# Patient Record
Sex: Male | Born: 2000 | Race: White | Hispanic: No | Marital: Single | Smoking: Never smoker
Health system: Southern US, Community
[De-identification: ages and names within clinical notes are randomized; demographics above are authoritative.]

---
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
## 2022-04-18 ENCOUNTER — Ambulatory Visit
Admission: EM | Admit: 2022-04-18 | Discharge: 2022-04-18 | Disposition: A | Discharge status: Home / Self Care | Payer: Managed Care, Other (non HMO) | Attending: Nurse Practitioner | Admitting: Nurse Practitioner

## 2022-04-18 DIAGNOSIS — Z1152 Encounter for screening for COVID-19: Secondary | ICD-10-CM | POA: Insufficient documentation

## 2022-04-18 DIAGNOSIS — J029 Acute pharyngitis, unspecified: Secondary | ICD-10-CM | POA: Insufficient documentation

## 2022-04-18 LAB — POCT RAPID STREP A (OFFICE): Rapid Strep A Screen: NEGATIVE

## 2022-04-18 MED ORDER — BENZONATATE 100 MG PO CAPS: 100.0000 mg | ORAL_CAPSULE | Freq: Three times a day (TID) | ORAL | 0 refills | Status: DC | PRN

## 2022-04-18 MED ORDER — FLUTICASONE PROPIONATE 50 MCG/ACT NA SUSP: 1.0000 | Freq: Every day | NASAL | 0 refills | Status: DC

## 2022-04-18 NOTE — ED Provider Notes (Signed)
[facility]    CSN: [id number] Arrival date & time: 04/18/22  1121      History   Chief Complaint No chief complaint on file.   HPI Eduardo Klein is a 21 y.o. male.   Patient presents with 1 day of sore throat, dry cough, chest and nasal congestion, runny nose, postnasal drainage, sneezing, headache.  He denies fever, shortness of breath or chest pain, ear pain, abdominal pain, nausea/vomiting, diarrhea, decreased appetite, loss of taste or smell, new rash, and fatigue.  No known sick contacts.  Thinks his symptoms are secondary to allergies, however he wanted to come in and be sure before he went to work.  Has taken ibuprofen and acetaminophen for symptoms with minimal relief.    History reviewed. No pertinent past medical history.  There are no problems to display for this patient.   History reviewed. No pertinent surgical history.     Home Medications    Prior to Admission medications   Medication Sig Start Date End Date Taking? Authorizing Provider  benzonatate (TESSALON) 100 MG capsule Take 1 capsule (100 mg total) by mouth 3 (three) times daily as needed for cough. Do not take with alcohol or while driving or operating heavy machinery.  May cause drowsiness. 04/18/22  Yes Valentino Nose, NP  fluticasone (FLONASE) 50 MCG/ACT nasal spray Place 1 spray into both nostrils daily. 04/18/22  Yes Valentino Nose, NP    Family History History reviewed. No pertinent family history.  Social History Social History   Tobacco Use   Smoking status: Never   Smokeless tobacco: Never     Allergies   Patient has no known allergies.   Review of Systems Review of Systems Per HPI  Physical Exam Triage Vital Signs ED Triage Vitals  Enc Vitals Group     BP 04/18/22 1148 130/75     Pulse Rate 04/18/22 1148 (!) 15     Resp 04/18/22 1148 20     Temp 04/18/22 1148 98 F (36.7 C)     Temp Source 04/18/22 1148 Oral     SpO2 04/18/22 1148 98 %      Weight --      Height --      Head Circumference --      Peak Flow --      Pain Score 04/18/22 1149 6     Pain Loc --      Pain Edu? --      Excl. in GC? --    No data found.  Updated Vital Signs BP 130/75   Pulse 75   Temp 98 F (36.7 C) (Oral)   Resp 20   SpO2 98%   Visual Acuity Right Eye Distance:   Left Eye Distance:   Bilateral Distance:    Right Eye Near:   Left Eye Near:    Bilateral Near:     Physical Exam Vitals and nursing note reviewed.  Constitutional:      General: He is not in acute distress.    Appearance: Normal appearance. He is not ill-appearing or toxic-appearing.  HENT:     Head: Normocephalic and atraumatic.     Right Ear: Tympanic membrane, ear canal and external ear normal. There is impacted cerumen.     Left Ear: Tympanic membrane, ear canal and external ear normal.     Nose: Rhinorrhea present. No congestion.     Mouth/Throat:     Mouth: Mucous membranes are moist.  Pharynx: Oropharynx is clear. No oropharyngeal exudate or posterior oropharyngeal erythema.  Eyes:     General: No scleral icterus.    Extraocular Movements: Extraocular movements intact.  Cardiovascular:     Rate and Rhythm: Normal rate and regular rhythm.  Pulmonary:     Effort: Pulmonary effort is normal. No respiratory distress.     Breath sounds: Normal breath sounds. No wheezing, rhonchi or rales.  Abdominal:     General: Abdomen is flat. Bowel sounds are normal. There is no distension.     Palpations: Abdomen is soft.     Tenderness: There is no abdominal tenderness. There is no guarding.  Musculoskeletal:     Cervical back: Normal range of motion and neck supple.  Lymphadenopathy:     Cervical: No cervical adenopathy.  Skin:    General: Skin is warm and dry.     Coloration: Skin is not jaundiced or pale.     Findings: No erythema or rash.  Neurological:     Mental Status: He is alert and oriented to person, place, and time.  Psychiatric:         Behavior: Behavior is cooperative.      UC Treatments / Results  Labs (all labs ordered are listed, but only abnormal results are displayed) Labs Reviewed  SARS CORONAVIRUS 2 (TAT 6-24 HRS)  POCT RAPID STREP A (OFFICE)    EKG   Radiology No results found.  Procedures Procedures (including critical care time)  Medications Ordered in UC Medications - No data to display  Initial Impression / Assessment and Plan / UC Course  I have reviewed the triage vital signs and the nursing notes.  Pertinent labs & imaging results that were available during my care of the patient were reviewed by me and considered in my medical decision making (see chart for details).   Patient is well-appearing, normotensive, afebrile, not tachycardic, not tachypneic, oxygenating well on room air.    Encounter for screening for COVID-19 Acute pharyngitis, unspecified etiology Rapid strep throat test negative, throat culture deferred given examination Differentials include viral sore throat versus postnasal drainage secondary to allergic rhinitis COVID-19 testing obtained Start nasal steroid spray, oral antihistamine Can also start cough suppressant as needed for dry cough Other supportive care discussed ER and return precautions discussed Note given for work  The patient was given the opportunity to ask questions.  All questions answered to their satisfaction.  The patient is in agreement to this plan.    Final Clinical Impressions(s) / UC Diagnoses   Final diagnoses:  Encounter for screening for COVID-19  Acute pharyngitis, unspecified etiology     Discharge Instructions      Symptoms are consistent with postnasal drainage secondary to allergic rhinitis.  Please start nasal steroid spray daily and oral antihistamine like cetirizine, fexofenadine, or similar.  You may have a viral upper respiratory infection.  If so, the symptoms should improve over the next week to 10 days.  If you develop  chest pain or shortness of breath, go to the emergency room.  We have tested you today for COVID-19.  You will see the results in Mychart and we will call you with positive results.    Please stay home and isolate until you are aware of the results.  The rapid strep throat test today is negative.  Some things that can make you feel better are: - Increased rest - Increasing fluid with water/sugar free electrolytes - Acetaminophen and ibuprofen as needed for fever/pain -  Salt water gargling, chloraseptic spray and throat lozenges - OTC guaifenesin (Mucinex) 600 mg twice daily - Saline sinus flushes or a neti pot - Humidifying the air -Tessalon Perles during the day as needed for dry cough      ED Prescriptions     Medication Sig Dispense Auth. Provider   fluticasone (FLONASE) 50 MCG/ACT nasal spray Place 1 spray into both nostrils daily. 16 g Noemi Chapel A, NP   benzonatate (TESSALON) 100 MG capsule Take 1 capsule (100 mg total) by mouth 3 (three) times daily as needed for cough. Do not take with alcohol or while driving or operating heavy machinery.  May cause drowsiness. 21 capsule Eulogio Bear, NP      PDMP not reviewed this encounter.   Eulogio Bear, NP 04/18/22 1218

## 2022-04-18 NOTE — Discharge Instructions (Addendum)
Symptoms are consistent with postnasal drainage secondary to allergic rhinitis.  Please start nasal steroid spray daily and oral antihistamine like cetirizine, fexofenadine, or similar.  You may have a viral upper respiratory infection.  If so, the symptoms should improve over the next week to 10 days.  If you develop chest pain or shortness of breath, go to the emergency room.  We have tested you today for COVID-19.  You will see the results in Mychart and we will call you with positive results.    Please stay home and isolate until you are aware of the results.  The rapid strep throat test today is negative.  Some things that can make you feel better are: - Increased rest - Increasing fluid with water/sugar free electrolytes - Acetaminophen and ibuprofen as needed for fever/pain - Salt water gargling, chloraseptic spray and throat lozenges - OTC guaifenesin (Mucinex) 600 mg twice daily - Saline sinus flushes or a neti pot - Humidifying the air -Tessalon Perles during the day as needed for dry cough

## 2022-04-18 NOTE — ED Triage Notes (Signed)
Pt reports yesterday hard to swallow, runny nose, and sore throat. Took ibuprofen and two tylenol but no relief.

## 2022-04-19 LAB — SARS CORONAVIRUS 2 (TAT 6-24 HRS): SARS Coronavirus 2: NEGATIVE

## 2022-06-22 ENCOUNTER — Encounter: Payer: Self-pay | Admitting: Emergency Medicine

## 2022-06-22 ENCOUNTER — Other Ambulatory Visit: Payer: Self-pay

## 2022-06-22 ENCOUNTER — Ambulatory Visit
Admission: EM | Admit: 2022-06-22 | Discharge: 2022-06-22 | Disposition: A | Discharge status: Home / Self Care | Payer: Managed Care, Other (non HMO) | Attending: Nurse Practitioner | Admitting: Nurse Practitioner

## 2022-06-22 ENCOUNTER — Ambulatory Visit
Admission: EM | Admit: 2022-06-22 | Discharge: 2022-06-22 | Disposition: A | Discharge status: Home / Self Care | Payer: Managed Care, Other (non HMO) | Attending: Family Medicine | Admitting: Family Medicine

## 2022-06-22 DIAGNOSIS — Z1152 Encounter for screening for COVID-19: Secondary | ICD-10-CM

## 2022-06-22 DIAGNOSIS — H9201 Otalgia, right ear: Secondary | ICD-10-CM

## 2022-06-22 DIAGNOSIS — J069 Acute upper respiratory infection, unspecified: Secondary | ICD-10-CM | POA: Diagnosis not present

## 2022-06-22 MED ORDER — FLUTICASONE PROPIONATE 50 MCG/ACT NA SUSP: 1.0000 | Freq: Every day | NASAL | 0 refills | Status: AC

## 2022-06-22 MED ORDER — AMOXICILLIN 875 MG PO TABS: 875.0000 mg | ORAL_TABLET | Freq: Two times a day (BID) | ORAL | 0 refills | Status: AC

## 2022-06-22 MED ORDER — PROMETHAZINE-DM 6.25-15 MG/5ML PO SYRP: 5.0000 mL | ORAL_SOLUTION | Freq: Every evening | ORAL | 0 refills | Status: AC | PRN

## 2022-06-22 MED ORDER — BENZONATATE 100 MG PO CAPS: 100.0000 mg | ORAL_CAPSULE | Freq: Three times a day (TID) | ORAL | 0 refills | Status: AC | PRN

## 2022-06-22 NOTE — ED Provider Notes (Signed)
[facility]    CSN: [id number] Arrival date & time: 06/22/22  0944      History   Chief Complaint Chief Complaint  Patient presents with   Sore Throat    HPI Eduardo Klein is a 22 y.o. male.   Patient presents today for 2-day history of congested cough, chest and nasal congestion, runny nose, postnasal drainage and sore throat, slight headache, right ear pain, and slightly decreased appetite.  Also endorses fatigue.  Patient denies fever, shortness of breath or chest pain, abdominal pain, nausea/vomiting, diarrhea.  Has been taking Mucinex and ibuprofen for the symptoms which does help.  Denies ear drainage.  Reports a couple of weeks ago, he had flulike symptoms, however fully recovered from that prior to this illness starting.    History reviewed. No pertinent past medical history.  There are no problems to display for this patient.   History reviewed. No pertinent surgical history.     Home Medications    Prior to Admission medications   Medication Sig Start Date End Date Taking? Authorizing Provider  promethazine-dextromethorphan (PROMETHAZINE-DM) 6.25-15 MG/5ML syrup Take 5 mLs by mouth at bedtime as needed for cough. Do not take with alcohol or while driving or operating heavy machinery.  May cause drowsiness. 06/22/22  Yes Valentino Nose, NP  benzonatate (TESSALON) 100 MG capsule Take 1 capsule (100 mg total) by mouth 3 (three) times daily as needed for cough. Do not take with alcohol or while driving or operating heavy machinery.  May cause drowsiness. 06/22/22   Valentino Nose, NP  fluticasone (FLONASE) 50 MCG/ACT nasal spray Place 1 spray into both nostrils daily. 06/22/22   Valentino Nose, NP    Family History History reviewed. No pertinent family history.  Social History Social History   Tobacco Use   Smoking status: Never   Smokeless tobacco: Never     Allergies   Patient has no known allergies.   Review of  Systems Review of Systems Per HPI  Physical Exam Triage Vital Signs ED Triage Vitals [06/22/22 0957]  Enc Vitals Group     BP 133/72     Pulse Rate 65     Resp 20     Temp 97.9 F (36.6 C)     Temp Source Oral     SpO2 98 %     Weight      Height      Head Circumference      Peak Flow      Pain Score 6     Pain Loc      Pain Edu?      Excl. in GC?    No data found.  Updated Vital Signs BP 133/72 (BP Location: Right Arm)   Pulse 65   Temp 97.9 F (36.6 C) (Oral)   Resp 20   SpO2 98%   Visual Acuity Right Eye Distance:   Left Eye Distance:   Bilateral Distance:    Right Eye Near:   Left Eye Near:    Bilateral Near:     Physical Exam Vitals and nursing note reviewed.  Constitutional:      General: He is not in acute distress.    Appearance: Normal appearance. He is not ill-appearing or toxic-appearing.  HENT:     Head: Normocephalic and atraumatic.     Right Ear: Tympanic membrane, ear canal and external ear normal. No swelling or tenderness. No middle ear effusion. There is impacted cerumen. Tympanic membrane is  not erythematous.     Left Ear: Tympanic membrane, ear canal and external ear normal. No drainage, swelling or tenderness.  No middle ear effusion. Tympanic membrane is not erythematous.     Nose: Congestion present. No rhinorrhea.     Mouth/Throat:     Mouth: Mucous membranes are moist.     Pharynx: Oropharynx is clear. Posterior oropharyngeal erythema present. No oropharyngeal exudate.     Tonsils: No tonsillar exudate.  Eyes:     General: No scleral icterus.    Extraocular Movements: Extraocular movements intact.  Cardiovascular:     Rate and Rhythm: Normal rate and regular rhythm.  Pulmonary:     Effort: Pulmonary effort is normal. No respiratory distress.     Breath sounds: Normal breath sounds. No wheezing, rhonchi or rales.  Abdominal:     General: Abdomen is flat. Bowel sounds are normal. There is no distension.     Palpations: Abdomen  is soft.  Musculoskeletal:     Cervical back: Normal range of motion and neck supple.  Lymphadenopathy:     Cervical: No cervical adenopathy.  Skin:    General: Skin is warm and dry.     Coloration: Skin is not jaundiced or pale.     Findings: No erythema or rash.  Neurological:     Mental Status: He is alert and oriented to person, place, and time.     Motor: No weakness.  Psychiatric:        Behavior: Behavior is cooperative.      UC Treatments / Results  Labs (all labs ordered are listed, but only abnormal results are displayed) Labs Reviewed  SARS CORONAVIRUS 2 (TAT 6-24 HRS)    EKG   Radiology No results found.  Procedures Procedures (including critical care time)  Medications Ordered in UC Medications - No data to display  Initial Impression / Assessment and Plan / UC Course  I have reviewed the triage vital signs and the nursing notes.  Pertinent labs & imaging results that were available during my care of the patient were reviewed by me and considered in my medical decision making (see chart for details).   Patient is well-appearing, normotensive, afebrile, not tachycardic, not tachypneic, oxygenating well on room air.    Viral URI with cough Encounter for screening for COVID-19 Suspect viral etiology Unable to view right TM secondary to cerumen, ear lavage deferred today given possible otitis COVID-19 testing performed Supportive care discussed with patient Start cough suppressant as needed for dry cough Note given for work ER and return precautions discussed with patient  The patient was given the opportunity to ask questions.  All questions answered to their satisfaction.  The patient is in agreement to this plan.    Final Clinical Impressions(s) / UC Diagnoses   Final diagnoses:  Viral URI with cough  Encounter for screening for COVID-19     Discharge Instructions      You have a viral upper respiratory infection.  Symptoms should improve  over the next week to 10 days.  If you develop chest pain or shortness of breath, go to the emergency room.  We have tested you today for COVID-19.  You will see the results in Mychart and we will call you with positive results.    Please stay home and isolate until you are aware of the results.    Some things that can make you feel better are: - Increased rest - Increasing fluid with water/sugar free electrolytes - Acetaminophen  and ibuprofen as needed for fever/ear pain - Salt water gargling, chloraseptic spray and throat lozenges - OTC guaifenesin (Mucinex) 600 mg twice daily for congestion - Saline sinus flushes or a neti pot for congestion - Humidifying the air -Tessalon Perles during the day as needed for dry cough and cough syrup at nighttime as needed for dry cough     ED Prescriptions     Medication Sig Dispense Auth. Provider   benzonatate (TESSALON) 100 MG capsule Take 1 capsule (100 mg total) by mouth 3 (three) times daily as needed for cough. Do not take with alcohol or while driving or operating heavy machinery.  May cause drowsiness. 21 capsule Noemi Chapel A, NP   fluticasone (FLONASE) 50 MCG/ACT nasal spray Place 1 spray into both nostrils daily. 16 g Noemi Chapel A, NP   promethazine-dextromethorphan (PROMETHAZINE-DM) 6.25-15 MG/5ML syrup Take 5 mLs by mouth at bedtime as needed for cough. Do not take with alcohol or while driving or operating heavy machinery.  May cause drowsiness. 118 mL Eulogio Bear, NP      PDMP not reviewed this encounter.   Eulogio Bear, NP 06/22/22 1146

## 2022-06-22 NOTE — ED Notes (Signed)
Gentle ear lavage provided per verbal of PA with syringe to right ear. Pt tolerated fairly well with no drainage noted. PA aware.

## 2022-06-22 NOTE — ED Triage Notes (Signed)
Pt reports sore throat, cough x3 days and reports bilateral ear pain x2 days.

## 2022-06-22 NOTE — ED Triage Notes (Signed)
Pt reports right ear pain that intensified this afternoon. Pt reports picked up prescription for flonase and reports tried ear wax removal at home and reports right ear pain got worse and reports "left ear feels like its getting the same way." Tylenol/ibuprofen has not helped with pain.

## 2022-06-22 NOTE — Discharge Instructions (Signed)
You have a viral upper respiratory infection.  Symptoms should improve over the next week to 10 days.  If you develop chest pain or shortness of breath, go to the emergency room.  We have tested you today for COVID-19.  You will see the results in Mychart and we will call you with positive results.    Please stay home and isolate until you are aware of the results.    Some things that can make you feel better are: - Increased rest - Increasing fluid with water/sugar free electrolytes - Acetaminophen and ibuprofen as needed for fever/ear pain - Salt water gargling, chloraseptic spray and throat lozenges - OTC guaifenesin (Mucinex) 600 mg twice daily for congestion - Saline sinus flushes or a neti pot for congestion - Humidifying the air -Tessalon Perles during the day as needed for dry cough and cough syrup at nighttime as needed for dry cough

## 2022-06-23 LAB — SARS CORONAVIRUS 2 (TAT 6-24 HRS): SARS Coronavirus 2: NEGATIVE

## 2022-06-26 NOTE — ED Provider Notes (Signed)
[facility]    CSN: [id number] Arrival date & time: 06/22/22  1805      History   Chief Complaint Chief Complaint  Patient presents with   Otalgia    HPI Eduardo Klein is a 22 y.o. male.   Patient presenting today with worsening right ear pain since this morning.  States he was seen this morning, treated for a viral respiratory infection with Flonase, cough syrup and over-the-counter medications.  Tried some wax removal kits at home and states the right ear pain has become worse since then.  Denies fever, chills, drainage, headaches, nausea, vomiting.  Mom who is with him today states he had a lot of ear infections as a baby.    History reviewed. No pertinent past medical history.  There are no problems to display for this patient.   History reviewed. No pertinent surgical history.     Home Medications    Prior to Admission medications   Medication Sig Start Date End Date Taking? Authorizing Provider  amoxicillin (AMOXIL) 875 MG tablet Take 1 tablet (875 mg total) by mouth 2 (two) times daily. 06/22/22  Yes Particia Nearing, PA-C  benzonatate (TESSALON) 100 MG capsule Take 1 capsule (100 mg total) by mouth 3 (three) times daily as needed for cough. Do not take with alcohol or while driving or operating heavy machinery.  May cause drowsiness. 06/22/22   Valentino Nose, NP  fluticasone (FLONASE) 50 MCG/ACT nasal spray Place 1 spray into both nostrils daily. 06/22/22   Valentino Nose, NP  promethazine-dextromethorphan (PROMETHAZINE-DM) 6.25-15 MG/5ML syrup Take 5 mLs by mouth at bedtime as needed for cough. Do not take with alcohol or while driving or operating heavy machinery.  May cause drowsiness. 06/22/22   Valentino Nose, NP    Family History History reviewed. No pertinent family history.  Social History Social History   Tobacco Use   Smoking status: Never   Smokeless tobacco: Never     Allergies   Patient has no known  allergies.   Review of Systems Review of Systems Per HPI  Physical Exam Triage Vital Signs ED Triage Vitals [06/22/22 1934]  Enc Vitals Group     BP 117/80     Pulse Rate 62     Resp 20     Temp (!) 97.4 F (36.3 C)     Temp Source Oral     SpO2 98 %     Weight      Height      Head Circumference      Peak Flow      Pain Score 6     Pain Loc      Pain Edu?      Excl. in GC?    No data found.  Updated Vital Signs BP 117/80 (BP Location: Right Arm)   Pulse 62   Temp (!) 97.4 F (36.3 C) (Oral)   Resp 20   SpO2 98%   Visual Acuity Right Eye Distance:   Left Eye Distance:   Bilateral Distance:    Right Eye Near:   Left Eye Near:    Bilateral Near:     Physical Exam Vitals and nursing note reviewed.  Constitutional:      Appearance: He is well-developed.  HENT:     Head: Atraumatic.     Right Ear: There is impacted cerumen.     Left Ear: Tympanic membrane and external ear normal.     Ears:  Comments: Unable to visualize right TM past impaction    Nose: Rhinorrhea present.     Mouth/Throat:     Pharynx: Posterior oropharyngeal erythema present. No oropharyngeal exudate.  Eyes:     Conjunctiva/sclera: Conjunctivae normal.     Pupils: Pupils are equal, round, and reactive to light.  Cardiovascular:     Rate and Rhythm: Normal rate and regular rhythm.  Pulmonary:     Effort: Pulmonary effort is normal. No respiratory distress.     Breath sounds: No wheezing or rales.  Musculoskeletal:        General: Normal range of motion.     Cervical back: Normal range of motion and neck supple.  Lymphadenopathy:     Cervical: No cervical adenopathy.  Skin:    General: Skin is warm and dry.  Neurological:     Mental Status: He is alert and oriented to person, place, and time.  Psychiatric:        Behavior: Behavior normal.      UC Treatments / Results  Labs (all labs ordered are listed, but only abnormal results are displayed) Labs Reviewed - No data  to display  EKG   Radiology No results found.  Procedures Procedures (including critical care time)  Medications Ordered in UC Medications - No data to display  Initial Impression / Assessment and Plan / UC Course  I have reviewed the triage vital signs and the nursing notes.  Pertinent labs & imaging results that were available during my care of the patient were reviewed by me and considered in my medical decision making (see chart for details).     Ear lavage was attempted today with gentle use of syringe, warm water and peroxide to the right ear did tend to clear impaction and see if there is an infection past that.  This was discontinued shortly after initiation due to some tenderness that the patient was experiencing.  Continue to be unable to visualize TM, and given his worsening pain will cover with antibiotic therapy for possible ear infection while continuing to use Debrox drops, warm water to clear wax plug.  Return for worsening symptoms.  Final Clinical Impressions(s) / UC Diagnoses   Final diagnoses:  Right ear pain   Discharge Instructions   None    ED Prescriptions     Medication Sig Dispense Auth. Provider   amoxicillin (AMOXIL) 875 MG tablet Take 1 tablet (875 mg total) by mouth 2 (two) times daily. 20 tablet Volney American, [place]      PDMP not reviewed this encounter.   Volney American, [place] 06/26/22 1559

## 2023-04-20 ENCOUNTER — Encounter: Payer: Self-pay | Admitting: Gastroenterology

## 2023-07-06 ENCOUNTER — Encounter: Payer: Self-pay | Admitting: Gastroenterology

## 2023-07-18 ENCOUNTER — Ambulatory Visit: Payer: Managed Care, Other (non HMO) | Admitting: Gastroenterology

## 2023-08-14 ENCOUNTER — Ambulatory Visit: Payer: Managed Care, Other (non HMO) | Admitting: Gastroenterology
